# Patient Record
Sex: Male | Born: 1950 | Race: White | Hispanic: Yes | Smoking: Never smoker
Health system: Southern US, Community
[De-identification: ages and names within clinical notes are randomized; demographics above are authoritative.]

## PROBLEM LIST (undated history)

## (undated) DIAGNOSIS — E785 Hyperlipidemia, unspecified: Secondary | ICD-10-CM

## (undated) DIAGNOSIS — R000 Tachycardia, unspecified: Secondary | ICD-10-CM

## (undated) DIAGNOSIS — E119 Type 2 diabetes mellitus without complications: Secondary | ICD-10-CM

## (undated) DIAGNOSIS — M1990 Unspecified osteoarthritis, unspecified site: Secondary | ICD-10-CM

## (undated) DIAGNOSIS — I10 Essential (primary) hypertension: Secondary | ICD-10-CM

## (undated) DIAGNOSIS — R011 Cardiac murmur, unspecified: Secondary | ICD-10-CM

## (undated) DIAGNOSIS — K219 Gastro-esophageal reflux disease without esophagitis: Secondary | ICD-10-CM

## (undated) DIAGNOSIS — R569 Unspecified convulsions: Secondary | ICD-10-CM

## (undated) DIAGNOSIS — N529 Male erectile dysfunction, unspecified: Secondary | ICD-10-CM

## (undated) DIAGNOSIS — R7302 Impaired glucose tolerance (oral): Secondary | ICD-10-CM

## (undated) DIAGNOSIS — I219 Acute myocardial infarction, unspecified: Secondary | ICD-10-CM

## (undated) HISTORY — DX: Male erectile dysfunction, unspecified: N52.9

## (undated) HISTORY — DX: Unspecified convulsions: R56.9

## (undated) HISTORY — PX: NOSE SURGERY: SHX723

## (undated) HISTORY — DX: Essential (primary) hypertension: I10

## (undated) HISTORY — DX: Cardiac murmur, unspecified: R01.1

## (undated) HISTORY — DX: Tachycardia, unspecified: R00.0

## (undated) HISTORY — PX: MANDIBLE SURGERY: SHX707

## (undated) HISTORY — DX: Type 2 diabetes mellitus without complications: E11.9

## (undated) HISTORY — PX: OTHER SURGICAL HISTORY: SHX169

## (undated) HISTORY — DX: Unspecified osteoarthritis, unspecified site: M19.90

## (undated) HISTORY — DX: Impaired glucose tolerance (oral): R73.02

## (undated) HISTORY — DX: Gastro-esophageal reflux disease without esophagitis: K21.9

## (undated) HISTORY — DX: Hyperlipidemia, unspecified: E78.5

## (undated) HISTORY — PX: KNEE ARTHROSCOPY: SUR90

## (undated) HISTORY — DX: Acute myocardial infarction, unspecified: I21.9

---

## 2006-10-30 HISTORY — PX: COLONOSCOPY: SHX174

## 2006-10-30 HISTORY — PX: ESOPHAGOGASTRODUODENOSCOPY: SHX1529

## 2014-05-19 ENCOUNTER — Other Ambulatory Visit (HOSPITAL_BASED_OUTPATIENT_CLINIC_OR_DEPARTMENT_OTHER): Payer: Self-pay | Admitting: Internal Medicine

## 2014-05-19 DIAGNOSIS — R1031 Right lower quadrant pain: Secondary | ICD-10-CM

## 2014-05-20 ENCOUNTER — Ambulatory Visit (HOSPITAL_BASED_OUTPATIENT_CLINIC_OR_DEPARTMENT_OTHER): Payer: PRIVATE HEALTH INSURANCE

## 2014-06-08 ENCOUNTER — Encounter: Payer: Self-pay | Admitting: *Deleted

## 2014-06-09 ENCOUNTER — Telehealth: Payer: Self-pay

## 2014-06-09 ENCOUNTER — Ambulatory Visit (INDEPENDENT_AMBULATORY_CARE_PROVIDER_SITE_OTHER): Payer: PRIVATE HEALTH INSURANCE | Admitting: Neurology

## 2014-06-09 ENCOUNTER — Encounter: Payer: Self-pay | Admitting: Neurology

## 2014-06-09 VITALS — BP 154/84 | HR 66 | Ht 67.0 in | Wt 183.0 lb

## 2014-06-09 DIAGNOSIS — E669 Obesity, unspecified: Secondary | ICD-10-CM

## 2014-06-09 DIAGNOSIS — R519 Headache, unspecified: Secondary | ICD-10-CM | POA: Insufficient documentation

## 2014-06-09 DIAGNOSIS — M542 Cervicalgia: Secondary | ICD-10-CM | POA: Insufficient documentation

## 2014-06-09 DIAGNOSIS — R51 Headache: Secondary | ICD-10-CM

## 2014-06-09 MED ORDER — NORTRIPTYLINE HCL 25 MG PO CAPS
25.0000 mg | ORAL_CAPSULE | Freq: Every day | ORAL | Status: DC
Start: 2014-06-09 — End: 2014-06-26

## 2014-06-09 NOTE — Telephone Encounter (Signed)
Called and spoke to patient and patient is aware that he can't have CT today. Patient understood and we tried to do everything we could. [place] is leaving for [place]. Dr. Lucia GaskinsAhern will call patient with further details.

## 2014-06-09 NOTE — Progress Notes (Signed)
[facility]    Provider:  Dr Lucia Gaskins Referring Provider: Jackie Plum, MD Primary Care Physician:  Jackie Plum, MD  CC:  headache  HPI:  Roy Mccoy is a 63 y.o. male here as a referral from Dr. Julio Sicks for headache   Headache started one month ago. At the same time he got new prescription glasses but not using them consistently. No inciting factors, just woke up with a headache one day. He is having some pain in the neck with tenderness for 5-6 months. He feels like he is not sleeping on his neck right but tenderness started after lifting a sofa 2-3 months ago and not related to headache. He got up one morning and had a headache and in the afternoon he started feeling a dull pain in the left frontal area. The feeling is constant all day long.  He works 10-12 hours a day but routine hasn't changed. No increased work stress. First time this has happened, may once once many years ago had a migranous episode but otherwise no headaches. No associated light, sound sensitivity. No nausea no vomiting. No associated focal neurologic symptoms. His physician gave him fioricet and been taking fioricet 3x a day and is weaning himself off but it has not helped or . Has also been taking advil 1-2x a day for his knee for a year a half. Has high blood pressure but is well controlled and his medication was increased recently which didn't help the headaches. Not sensitive to touch. Can get up to 7/10 severity.  No aura. Some days it is worse than others. Never had imaging of his brain. Leaving tomorrow at 5:30am for 10 days. Never had anything like this happen to him. No visual changes, no hearing change, no weakness, no sensory changes, no cognitive changess. No radicular symptoms but does have muscular pain in the left side of the neck, same side as the pain in the head although neck pain does not correspond with headache. Patient is sleeping well. Took hydrocodone once and didn't  help headache. Started on the pamelor 25mg  at night but not taking it consistently. Taking a baby aspirin. Headache Not positional. Nothing is helping the headache. No stabbing pain, no conjunctival injection, no lacrimation. Persistent all day. Doesn't wake him up at night. No family history of headaches or neurologic disorders.    Reviewed notes, labs and imaging from outside physicians, which detailed similar symptoms as patient describes today. Notes state that patient was also given 7 days of prednisone 10mg  daily, amlodipine was increased on 06/04/2014, also take atenolol for HTN.   Review of Systems: Out of a complete 14 system review, the patient complains of above symptoms per HPI as well as headache.   and all other reviewed systems are negative. Pertinent negatives per HPI.  History   Social History  . Marital Status: Divorced    Spouse Name: N/A    Number of Children: 4  . Years of Education: BA   Occupational History  .     Social History Main Topics  . Smoking status: Never Smoker   . Smokeless tobacco: Never Used  . Alcohol Use: Yes  . Drug Use: No  . Sexual Activity: Not on file   Other Topics Concern  . Not on file   Social History Narrative   Patient is single with 4 children   Patient is right handed   Patient has a BA degree   Patient drinks 1 cup daily.    No  family history on file.  Past Medical History  Diagnosis Date  . Hypertension   . Hyperlipidemia     No past surgical history on file.  Current Outpatient Prescriptions  Medication Sig Dispense Refill  . amLODipine (NORVASC) 10 MG tablet Take 10 mg by mouth daily.      Marland Kitchen. atenolol (TENORMIN) 100 MG tablet Take 100 mg by mouth daily.      . butalbital-acetaminophen-caffeine (FIORICET) 50-325-40 MG per tablet Take 1 tablet by mouth every 6 (six) hours as needed for headache (Take 1-2 tablets every 6 hours prn).      . diclofenac (VOLTAREN) 50 MG EC tablet Take 50 mg by mouth 3 (three) times  daily.      Marland Kitchen. HYDROcodone-ibuprofen (VICOPROFEN) 7.5-200 MG per tablet       . nortriptyline (PAMELOR) 25 MG capsule Take 25 mg by mouth at bedtime.      . predniSONE (DELTASONE) 10 MG tablet       . tiZANidine (ZANAFLEX) 2 MG tablet Take 2 mg by mouth every 6 (six) hours as needed for muscle spasms (Taking 1-2 tablets, 1-3 times daily as needed).      . vardenafil (LEVITRA) 20 MG tablet Take 20 mg by mouth daily.       No current facility-administered medications for this visit.    Allergies as of 06/09/2014 - Review Complete 06/09/2014  Allergen Reaction Noted  . Other  06/09/2014    Vitals: BP 154/84  Pulse 66  Ht 5\' 7"  (1.702 m)  Wt 183 lb (83.008 kg)  BMI 28.66 kg/m2 Last Weight:  Wt Readings from Last 1 Encounters:  06/09/14 183 lb (83.008 kg)   Last Height:   Ht Readings from Last 1 Encounters:  06/09/14 5\' 7"  (1.702 m)     Physical exam: Exam: Gen: NAD, conversant Eyes: anicteric sclerae, moist conjunctivae HENT: Atraumatic, oropharynx clear Neck: Trachea midline; supple,  Lungs: CTA, no wheezing, rales, rhonic                          CV: RRR, no MRG Abdomen: Soft, non-tender;  Extremities: No peripheral edema  Skin: Normal temperature, no rash,  Psych: Appropriate affect, pleasant  Neuro: MS: AA&Ox3, appropriately interactive, normal affect   Speech: fluent w/o paraphasic error  Memory: good recent and remote recall  CN: PERRL, EOMI no nystagmus, fundi flat, no ptosis, sensation intact to LT V1-V3 bilat, face symmetric, no weakness, hearing grossly intact, palate elevates symmetrically, shoulder shrug 5/5 bilat,  tongue protrudes midline, no fasiculations noted.  Motor: normal bulk and tone Strength: 5/5  In all extremities  Coord: rapid alternating and point-to-point (FNF, HTS) movements intact.  Reflexes: symmetrical, bilat downgoing toes  Sens: LT intact in all extremities  Gait: posture, stance, stride and arm-swing normal. Tandem gait  intact. Able to walk on heels and toes. Romberg absent.   Assessment and plan:  63 year old male with a PMHx of HTN, high cholesterol, glucose intolerance, LBP, erectile dysfunction who is here for new onset, focal, severe head pain. Patient does not have a PMHx of headaches. Headache was sudden in onset and is localized to the left frontal area, not positional. No migrainous features and unusual in the dull quality, sudden and constant nature and restricted area of the pain. The pain is not sensitive to touch or palpation. Patient has tried OTC medications as well as prescription medications such as Pamelor, Prednisone, hydrocodone without effect. Symptoms do  not appear migrainous or tension-type or neuralgia. Neuro exam was normal and patient does not report any focal neurologic symptoms. May be a headache/migraine variant or cervicogenic pain but need to rule out intracranial causes such as mass lesion. Will order an MRI of the brain. Patient can continue Pamelor but discussed compliance and taking it every evening. Discontinue the hydrocodone with ibuprofen and decrease the fioricet until stopped. Discussed rebound headaches, good sleep hygiene, staying well hydrated and eating regular meals with daily moderate exercise as tolerated. Proceed to the ED if symptoms worsen.   A total of 75 minutes was spent in with this patient. Over half this time was spent on counseling patient on the diagnosis and different therapeutic options available.    Naomie Dean, MD  [facility] [address] [place], [place] [address]  Phone [phone] Fax [phone]

## 2014-06-09 NOTE — Patient Instructions (Addendum)
Please proceed to imaging today for a scan of your head and brain.  Continue the pamelor 25mg  at night. We discussed the most common side effects such as fatigue, drowsiness, dizzyness, dry mouth Discontinue the hydrocodone with ibuprofen and decrease the fioricet until stopped  Remember to drink plenty of fluid, eat healthy meals and do not skip any meals. Try to eat protein with a every meal and eat a healthy snack such as fruit or nuts in between meals. Try to keep a regular sleep-wake schedule and try to exercise daily, particularly in the form of walking, 20-30 minutes a day, if you can.   I would like to see you back when you return from [place] in one month, sooner if we need to. Please call us with any interim questions, concerns, problems, updates or refill requests. If symptoms worsen or progress please go directly to an emergency room.  We will call you with test results so we can go over those with you on the phone.  My clinical assistant and will answer any of your questions and relay your messages to me and also relay most of my messages to you.   Our phone number is [phone]. We also have an after hours call service for urgent matters and there is a physician on-call for urgent questions. For any emergencies you know to call 911 or go to the nearest emergency room

## 2014-06-10 NOTE — Telephone Encounter (Signed)
Dr.Ahern did order MRI. Patient will be having in [place] , Patient will call me back 06/11/2014. To tell me where he wants the location. For Insurance we have to have location MRI.

## 2014-06-11 NOTE — Telephone Encounter (Signed)
Patient called back and he wants to have his MRI at. [facility]. [place] [id number] Phone numbers [phone] or [phone] - Carollee HerterShannon will finish getting MRI done.

## 2014-06-23 DIAGNOSIS — R51 Headache: Secondary | ICD-10-CM

## 2014-06-26 ENCOUNTER — Other Ambulatory Visit: Payer: Self-pay | Admitting: Neurology

## 2014-06-26 ENCOUNTER — Other Ambulatory Visit: Payer: Self-pay | Admitting: Diagnostic Neuroimaging

## 2014-06-26 ENCOUNTER — Telehealth: Payer: Self-pay | Admitting: Neurology

## 2014-06-26 DIAGNOSIS — R51 Headache: Secondary | ICD-10-CM

## 2014-06-26 MED ORDER — NORTRIPTYLINE HCL 50 MG PO CAPS: 50.0000 mg | ORAL_CAPSULE | Freq: Every day | ORAL | Status: DC

## 2014-06-26 NOTE — Telephone Encounter (Signed)
Pt requesting MRI results. Please advise.

## 2014-06-26 NOTE — Telephone Encounter (Signed)
Patient requesting results of recent MRI. Best number to call is [phone] anytime of day.

## 2014-06-26 NOTE — Telephone Encounter (Signed)
Spoke with patient and discussed the mri of his brain. Mild non-specific white matter disease, informed patient that he should monitor and tightly manage blood pressure and other vascular risk factors such as glucose and cholesterol with his primary care. Regular follow up is important. Headaches have improved. Will increase his Pamelor to  qhs. He would like to come to the office on Monday to check in. Please put him on my schedule for Monday August 31st as an add on at 1pm. Thank you.

## 2014-06-29 ENCOUNTER — Ambulatory Visit (INDEPENDENT_AMBULATORY_CARE_PROVIDER_SITE_OTHER): Payer: PRIVATE HEALTH INSURANCE | Admitting: Neurology

## 2014-06-29 ENCOUNTER — Encounter: Payer: Self-pay | Admitting: Neurology

## 2014-06-29 VITALS — BP 139/83 | HR 65 | Ht 67.0 in | Wt 184.0 lb

## 2014-06-29 DIAGNOSIS — M542 Cervicalgia: Secondary | ICD-10-CM

## 2014-06-29 DIAGNOSIS — R51 Headache: Secondary | ICD-10-CM

## 2014-06-29 NOTE — Telephone Encounter (Signed)
Patient is on Dr.Ahern schedule for today.

## 2014-06-29 NOTE — Progress Notes (Signed)
[facility]    Provider:  Dr Lucia Gaskins Referring Provider: Jackie Plum, MD Primary Care Physician:  Jackie Plum, MD  CC:  Headache  HPI:  Roy Mccoy is a 63 y.o. male here as a referral from Dr. Julio Sicks for Headache  Interval History: He had some dizziness in [place]. He was started on meclizine. It is resolved. While he was in [place] the headache improved. But now the headache is back to what it was before. He feels like a rubber band sensation from the back of his neck to the head. When he twists his head to the left, he gets a little sensation of the headache he complains about. The feeling is still pressure in a localized area. Not stinging, not tingling, not burning, not sensitive to the touch. No sound sensitivity, no light sensitivity. Meclizine resolved the dizzyness, got up with the sensation of the room shaking like shaking a camera. No side effects to the pamelor, no excessive sedation or dry mouth.  Also getting some sinus type pressure pain in the forehead. No rhinorrhea or tearing or eye injection. Discomfort is down on the left of the neck. Has a clicking noise in his neck. No radicular symptoms but lots of neck pain and pain on turning the head. Prednisone pack didn't help.   HPI: Roy Mccoy is a 63 y.o. male here as a referral from Dr. Julio Sicks for headache  Headache started one month ago. At the same time he got new prescription glasses but not using them consistently. No inciting factors, just woke up with a headache one day. He is having some pain in the neck with tenderness for 5-6 months. He feels like he is not sleeping on his neck right but tenderness started after lifting a sofa 2-3 months ago and not related to headache. He got up one morning and had a headache and in the afternoon he started feeling a dull pain in the left frontal area. The feeling is constant all day long. He works 10-12 hours a day but routine hasn't  changed. No increased work stress. First time this has happened, may once once many years ago had a migranous episode but otherwise no headaches. No associated light, sound sensitivity. No nausea no vomiting. No associated focal neurologic symptoms. His physician gave him fioricet and been taking fioricet 3x a day and is weaning himself off but it has not helped or . Has also been taking advil 1-2x a day for his knee for a year a half. Has high blood pressure but is well controlled and his medication was increased recently which didn't help the headaches. Not sensitive to touch. Can get up to 7/10 severity. No aura. Some days it is worse than others. Never had imaging of his brain. Leaving tomorrow at 5:30am for 10 days. Never had anything like this happen to him. No visual changes, no hearing change, no weakness, no sensory changes, no cognitive changess. No radicular symptoms but does have muscular pain in the left side of the neck, same side as the pain in the head although neck pain does not correspond with headache. Patient is sleeping well. Took hydrocodone once and didn't help headache. Started on the pamelor  at night but not taking it consistently. Taking a baby aspirin. Headache Not positional. Nothing is helping the headache. No stabbing pain, no conjunctival injection, no lacrimation. Persistent all day. Doesn't wake him up at night. No family history of headaches or neurologic disorders.  Reviewed notes, labs and  imaging from outside physicians, which detailed similar symptoms as patient describes today. Notes state that patient was also given 7 days of prednisone  daily, amlodipine was increased on 06/04/2014, also take atenolol for HTN.    Reviewed notes, labs and imaging from outside physicians, which showed MRI of the brain unremarkable, some mild non-specific white-matter disease  Review of Systems: Patient complains of symptoms per HPI as well as the following symptoms dizziness and  headache. Pertinent negatives per HPI. Otherwise out of a complete 14 system review, and all other reviewed systems are negative.   History   Social History  . Marital Status: Divorced    Spouse Name: N/A    Number of Children: 4  . Years of Education: BA   Occupational History  .     Social History Main Topics  . Smoking status: Never Smoker   . Smokeless tobacco: Never Used  . Alcohol Use: No  . Drug Use: No  . Sexual Activity: Not on file   Other Topics Concern  . Not on file   Social History Narrative   Patient is single with 4 children   Patient is right handed   Patient has a BA degree   Patient drinks 1 cup daily.    Family History  Problem Relation Age of Onset  . Headache Neg Hx     Past Medical History  Diagnosis Date  . Hypertension   . Hyperlipidemia   . Glucose intolerance (impaired glucose tolerance)   . Erectile dysfunction     Past Surgical History  Procedure Laterality Date  . Replacement total knee      Current Outpatient Prescriptions  Medication Sig Dispense Refill  . amLODipine (NORVASC) 10 MG tablet Take 10 mg by mouth daily.      Marland Kitchen atenolol (TENORMIN) 100 MG tablet Take 100 mg by mouth daily.      . diclofenac (VOLTAREN) 50 MG EC tablet Take 50 mg by mouth 3 (three) times daily.      . nortriptyline (PAMELOR) 50 MG capsule Take 1 capsule (50 mg total) by mouth at bedtime.  30 capsule  3  . predniSONE (DELTASONE) 10 MG tablet       . tiZANidine (ZANAFLEX) 2 MG tablet Take 2 mg by mouth every 6 (six) hours as needed for muscle spasms (Taking 1-2 tablets, 1-3 times daily as needed).      . vardenafil (LEVITRA) 20 MG tablet Take 20 mg by mouth daily.      . butalbital-acetaminophen-caffeine (FIORICET, ESGIC) 50-325-40 MG per tablet        No current facility-administered medications for this visit.    Allergies as of 06/29/2014 - Review Complete 06/29/2014  Allergen Reaction Noted  . Other  06/09/2014    Vitals: BP 139/83  Pulse  65  Ht  (1.702 m)  Wt 184 lb (83.462 kg)  BMI 28.81 kg/m2 Last Weight:  Wt Readings from Last 1 Encounters:  06/29/14 184 lb (83.462 kg)   Last Height:   Ht Readings from Last 1 Encounters:  06/29/14  (1.702 m)   Physical exam:  Exam:  Gen: NAD, conversant  Eyes: anicteric sclerae, moist conjunctivae  HENT: Atraumatic, oropharynx clear  Neck: Trachea midline; supple,  Lungs: CTA, no wheezing, rales, rhonic  CV: RRR, no MRG  Abdomen: Soft, non-tender;  Extremities: No peripheral edema  Skin: Normal temperature, no rash,  Psych: Appropriate affect, pleasant  Neuro:  MS: AA&Ox3, appropriately interactive, normal affect  Speech:  fluent w/o paraphasic error   Memory: good recent and remote recall   CN:  PERRL, EOMI no nystagmus, fundi flat, no ptosis, sensation intact to LT V1-V3 bilat, face symmetric, no weakness, hearing grossly intact, palate elevates symmetrically, shoulder shrug 5/5 bilat,  tongue protrudes midline, no fasiculations noted.  Motor: normal bulk and tone  Strength:  5/5 In all extremities  Coord: rapid alternating and point-to-point (FNF, HTS) movements intact.  Reflexes: symmetrical, bilat downgoing toes  Sens: LT intact in all extremities  Gait: posture, stance, stride and arm-swing normal. Tandem gait intact. Able to walk on heels and toes. Romberg absent.    Assessment and plan: 63 year old male with a PMHx of HTN, high cholesterol, glucose intolerance, LBP, erectile dysfunction who is here for new onset, focal, severe head pain. Patient does not have a PMHx of headaches. Headache was sudden in onset and is localized to the left frontal area, not positional. No migrainous features and unusual in the dull quality, sudden and constant nature and restricted area of the pain. The pain is not sensitive to touch or palpation. Patient has tried OTC medications as well as prescription medications such as Pamelor, Prednisone, hydrocodone without effect.  Symptoms do not appear migrainous. Today he does state the pain is more pressure like and radiates to the frontal region so possibly is tension type. Also says he feels like a rubber band snapping when he turns his head so a component or occipital neuralgia or cervicalgia? Neuro exam was normal and patient does not report any focal neurologic symptoms. MMRI of the brain unremarkable and reviewed wit him today. Patient can continue Pamelor at a higher dose but discussed compliance and taking it every evening. Discontinued the hydrocodone with ibuprofen and decrease the fioricet until stopped. Discussed rebound headaches, good sleep hygiene, staying well hydrated and eating regular meals with daily moderate exercise as tolerated. Proceed to the ED if symptoms worsen.   Will increase Pamelor to  at night. If doesn't help can change medication. Can take take tizanide for muscle spasms as prescribed Try an Occipital nerve block left side if these therapies fail Try sleeping on the opposite (right) side and use a cervical pillow.    Naomie Dean, MD  [facility] [address] [place], [place] [address]  Phone [phone] Fax [phone]

## 2014-06-29 NOTE — Patient Instructions (Signed)
Overall you are doing fairly well but I do want to suggest a few things today:   Remember to drink plenty of fluid, eat healthy meals and do not skip any meals. Try to eat protein with a every meal and eat a healthy snack such as fruit or nuts in between meals. Try to keep a regular sleep-wake schedule and try to exercise daily, particularly in the form of walking, 20-30 minutes a day, if you can.   As far as your medications are concerned, I would like to suggest continue with the increased dose of Pamelor (nortrityline).   As far as diagnostic testing: MRI of the cervical spine, physical therapy for neck pain  I would like to see you back in 3 months, sooner if we need to. Please call us with any interim questions, concerns, problems, updates or refill requests.   Please also call us for any test results so we can go over those with you on the phone.  My clinical assistant and will answer any of your questions and relay your messages to me and also relay most of my messages to you.   Our phone number is [phone]. We also have an after hours call service for urgent matters and there is a physician on-call for urgent questions. For any emergencies you know to call 911 or go to the nearest emergency room

## 2014-07-02 NOTE — Addendum Note (Signed)
Addended by: Naomie Dean B on: 07/02/2014 06:16 PM   Modules accepted: Orders

## 2014-07-08 ENCOUNTER — Other Ambulatory Visit: Payer: Self-pay | Admitting: Neurology

## 2014-07-08 ENCOUNTER — Telehealth: Payer: Self-pay | Admitting: Neurology

## 2014-07-08 MED ORDER — TOPIRAMATE 25 MG PO TABS: 50.0000 mg | ORAL_TABLET | Freq: Two times a day (BID) | ORAL | Status: DC

## 2014-07-08 NOTE — Telephone Encounter (Signed)
Spoke to patient and he relayed that the Pamelor has not helped, there is no change in his headache.  He asked that the doctor call him at [phone].

## 2014-07-09 ENCOUNTER — Ambulatory Visit: Payer: PRIVATE HEALTH INSURANCE | Attending: Neurology | Admitting: Physical Therapy

## 2014-07-09 DIAGNOSIS — M542 Cervicalgia: Secondary | ICD-10-CM | POA: Insufficient documentation

## 2014-07-09 DIAGNOSIS — IMO0001 Reserved for inherently not codable concepts without codable children: Secondary | ICD-10-CM | POA: Insufficient documentation

## 2014-07-09 DIAGNOSIS — R293 Abnormal posture: Secondary | ICD-10-CM | POA: Insufficient documentation

## 2014-07-10 MED ORDER — TOPIRAMATE 25 MG PO TABS: ORAL_TABLET | ORAL | Status: DC

## 2014-07-10 NOTE — Telephone Encounter (Signed)
Patient waiting on Rx for Topamax to be sent to Pharmacy.  Please call and advise

## 2014-07-10 NOTE — Telephone Encounter (Signed)
It appears Dr Lucia Gaskins added Topamax to med list on 09/09, and Rx was sent to print.  I have resent the Rx to pharmacy electronically.  I called the patient back.  Got no answer.  Left message.

## 2014-07-14 ENCOUNTER — Ambulatory Visit: Payer: PRIVATE HEALTH INSURANCE | Admitting: Rehabilitation

## 2014-07-17 ENCOUNTER — Ambulatory Visit: Payer: PRIVATE HEALTH INSURANCE | Admitting: Physical Therapy

## 2014-07-17 DIAGNOSIS — IMO0001 Reserved for inherently not codable concepts without codable children: Secondary | ICD-10-CM | POA: Diagnosis not present

## 2014-07-21 ENCOUNTER — Ambulatory Visit: Payer: PRIVATE HEALTH INSURANCE | Admitting: Rehabilitation

## 2014-07-23 ENCOUNTER — Ambulatory Visit: Payer: PRIVATE HEALTH INSURANCE | Admitting: Physical Therapy

## 2014-07-23 DIAGNOSIS — IMO0001 Reserved for inherently not codable concepts without codable children: Secondary | ICD-10-CM | POA: Diagnosis not present

## 2014-07-28 ENCOUNTER — Ambulatory Visit: Payer: PRIVATE HEALTH INSURANCE | Admitting: Physical Therapy

## 2014-07-28 DIAGNOSIS — IMO0001 Reserved for inherently not codable concepts without codable children: Secondary | ICD-10-CM | POA: Diagnosis not present

## 2014-07-30 ENCOUNTER — Ambulatory Visit: Payer: PRIVATE HEALTH INSURANCE | Attending: Neurology | Admitting: Physical Therapy

## 2014-07-30 DIAGNOSIS — R293 Abnormal posture: Secondary | ICD-10-CM | POA: Diagnosis not present

## 2014-07-30 DIAGNOSIS — M542 Cervicalgia: Secondary | ICD-10-CM | POA: Diagnosis not present

## 2014-07-30 DIAGNOSIS — Z5189 Encounter for other specified aftercare: Secondary | ICD-10-CM | POA: Insufficient documentation

## 2014-08-04 ENCOUNTER — Ambulatory Visit: Payer: PRIVATE HEALTH INSURANCE | Admitting: Physical Therapy

## 2014-08-04 DIAGNOSIS — Z5189 Encounter for other specified aftercare: Secondary | ICD-10-CM | POA: Diagnosis not present

## 2014-08-06 ENCOUNTER — Ambulatory Visit: Payer: PRIVATE HEALTH INSURANCE | Admitting: Rehabilitation

## 2014-08-06 DIAGNOSIS — Z5189 Encounter for other specified aftercare: Secondary | ICD-10-CM | POA: Diagnosis not present

## 2014-08-11 ENCOUNTER — Ambulatory Visit: Payer: PRIVATE HEALTH INSURANCE | Admitting: Physical Therapy

## 2014-08-13 ENCOUNTER — Ambulatory Visit: Payer: PRIVATE HEALTH INSURANCE | Admitting: Physical Therapy

## 2014-08-17 ENCOUNTER — Ambulatory Visit: Payer: PRIVATE HEALTH INSURANCE | Admitting: Physical Therapy

## 2014-08-18 ENCOUNTER — Ambulatory Visit: Payer: PRIVATE HEALTH INSURANCE | Admitting: Rehabilitation

## 2014-08-25 ENCOUNTER — Ambulatory Visit: Payer: PRIVATE HEALTH INSURANCE | Admitting: Physical Therapy

## 2014-08-27 ENCOUNTER — Ambulatory Visit: Payer: PRIVATE HEALTH INSURANCE | Admitting: Physical Therapy

## 2014-08-31 ENCOUNTER — Ambulatory Visit: Payer: PRIVATE HEALTH INSURANCE | Admitting: Physical Therapy

## 2014-09-01 ENCOUNTER — Ambulatory Visit: Payer: PRIVATE HEALTH INSURANCE | Attending: Neurology | Admitting: Physical Therapy

## 2014-09-01 DIAGNOSIS — R293 Abnormal posture: Secondary | ICD-10-CM | POA: Insufficient documentation

## 2014-09-01 DIAGNOSIS — Z5189 Encounter for other specified aftercare: Secondary | ICD-10-CM | POA: Insufficient documentation

## 2014-09-01 DIAGNOSIS — M542 Cervicalgia: Secondary | ICD-10-CM | POA: Insufficient documentation

## 2014-09-02 ENCOUNTER — Ambulatory Visit: Payer: PRIVATE HEALTH INSURANCE | Admitting: Physical Therapy

## 2014-09-03 ENCOUNTER — Ambulatory Visit: Payer: PRIVATE HEALTH INSURANCE | Admitting: Physical Therapy

## 2014-09-07 ENCOUNTER — Ambulatory Visit: Payer: PRIVATE HEALTH INSURANCE | Admitting: Physical Therapy

## 2014-09-10 ENCOUNTER — Ambulatory Visit: Payer: PRIVATE HEALTH INSURANCE | Admitting: Rehabilitation

## 2015-01-20 ENCOUNTER — Ambulatory Visit: Payer: PRIVATE HEALTH INSURANCE | Admitting: Podiatry

## 2015-10-31 HISTORY — PX: HIP SURGERY: SHX245

## 2018-06-05 ENCOUNTER — Ambulatory Visit: Payer: PRIVATE HEALTH INSURANCE | Admitting: Podiatry

## 2018-11-01 ENCOUNTER — Encounter: Payer: Self-pay | Admitting: Gastroenterology

## 2018-11-14 ENCOUNTER — Encounter: Payer: Self-pay | Admitting: Gastroenterology

## 2018-11-20 ENCOUNTER — Ambulatory Visit (AMBULATORY_SURGERY_CENTER): Payer: Self-pay | Admitting: *Deleted

## 2018-11-20 VITALS — Ht 67.0 in | Wt 186.0 lb

## 2018-11-20 DIAGNOSIS — Z1211 Encounter for screening for malignant neoplasm of colon: Secondary | ICD-10-CM

## 2018-11-20 NOTE — Progress Notes (Signed)
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 Gave AVS with date time and address to pt and wife today

## 2018-11-21 ENCOUNTER — Encounter: Payer: Self-pay | Admitting: *Deleted

## 2018-11-21 DIAGNOSIS — T884XXA Failed or difficult intubation, initial encounter: Secondary | ICD-10-CM | POA: Insufficient documentation

## 2018-11-22 ENCOUNTER — Encounter: Payer: Self-pay | Admitting: Gastroenterology

## 2018-11-22 ENCOUNTER — Ambulatory Visit: Payer: 59 | Admitting: Gastroenterology

## 2018-11-22 VITALS — BP 138/70 | HR 59 | Ht 67.0 in | Wt 186.1 lb

## 2018-11-22 DIAGNOSIS — Z1211 Encounter for screening for malignant neoplasm of colon: Secondary | ICD-10-CM | POA: Diagnosis not present

## 2018-11-22 DIAGNOSIS — K219 Gastro-esophageal reflux disease without esophagitis: Secondary | ICD-10-CM

## 2018-11-22 DIAGNOSIS — Z1212 Encounter for screening for malignant neoplasm of rectum: Secondary | ICD-10-CM | POA: Diagnosis not present

## 2018-11-22 MED ORDER — SOD PICOSULFATE-MAG OX-CIT ACD 10-3.5-12 MG-GM -GM/160ML PO SOLN: 1.0000 | ORAL | 0 refills | Status: AC

## 2018-11-22 NOTE — Patient Instructions (Addendum)
If you are age 67 or older, your body mass index should be between 23-30. Your Body mass index is 29.15 kg/m. If this is out of the aforementioned range listed, please consider follow up with your Primary Care Provider.  If you are age 37 or younger, your body mass index should be between 19-25. Your Body mass index is 29.15 kg/m. If this is out of the aformentioned range listed, please consider follow up with your Primary Care Provider.   You have been scheduled for an endoscopy and colonoscopy. Please follow the written instructions given to you at your visit today. Please pick up your prep supplies at the pharmacy within the next 1-3 days. If you use inhalers (even only as needed), please bring them with you on the day of your procedure. Your physician has requested that you go to www.startemmi.com and enter the access code given to you at your visit today. This web site gives a general overview about your procedure. However, you should still follow specific instructions given to you by our office regarding your preparation for the procedure.   We have sent the following medications to your pharmacy for you to pick up at your convenience: Clenpiq  Please purchase the following medications over the counter and take as directed: Ducolax  It was a pleasure to see you today!  Vito Cirigliano, D.O.

## 2018-11-22 NOTE — Progress Notes (Signed)
Chief Complaint: GERD, constipation, CRC screening   Referring Provider:   Shelbie ProctorYuri Milton Cabeza, MD [facility](PCM)   HPI:     Roy Mccoy is a 68 y.o. male referred to the Gastro[facility]ology Clinic for evaluation of:  1) CRC screening: Last colonsocopy was approximately 12 years ago in [place] and normal/no polyps, with recommendation to repeat in 10 years.  Otherwise, no hematochezia, melena, abdominal pain.  No known family history of CRC or GI malignancies.  2) GERD: Longstanding history of Daily HB and regurgitation. Sxs worse with spicy foods and espresso. Sxs currently well controlled with Prilosec 20 mg QAM, but now requires daily therapy and will predictably have breakthrough symptoms with any missed doses. No dysphagia or odynophagia.   3) Hx of H pylori: History of H. pylori "some years ago "treated with antimicrobial therapy without recurrence of abdominal pain.  4) Constipation: Endorses intermittent hard stool/straining which seems to coincide with taking Tylenol for knee pain.  Will typically take an OTC stool softener with resolution of symptoms.  Happens a couple times per month.  Had been treated with oxycodone in the past which certainly exacerbated the symptoms, which she stopped taking several months ago.  Interestingly, he states that he developed pleurisy after left knee arthroscopy and right hip replacement in the past.  Additionally, states that he has been told he has a "narrow airway" and difficulty with intubation with prior surgical procedures.  Otherwise no baseline pulmonary disease, asthma, etc.  No recent labs or imaging for review.  Previous endoscopy reports not available for review today.   Past Medical History:  Diagnosis Date  . Arthritis    hip , knee   . Diabetes mellitus without complication (HCC)   . Erectile dysfunction   . GERD (gastroesophageal reflux disease)   . Glucose intolerance (impaired glucose tolerance)   . Heart murmur   .  Hyperlipidemia   . Hypertension   . Myocardial infarction (HCC)    mild 12-13 years ago   . 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 Tachycardia    received amiodarone- 6 yrs ago [place]      Past Surgical History:  Procedure Laterality Date  . [place]ONOSCOPY  2008   in [place]  . ESOPHAGOGASTRODUODENOSCOPY  2008   in [place]  . HIP SURGERY Right 2017   replacement  . KNEE ARTHROSCOPY Left   . MANDIBLE SURGERY     broken jaw  . NOSE SURGERY     x2  . right foot surgery     Family History  Problem Relation Age of Onset  . Headache Neg Hx   . Colon cancer Neg Hx   . Colon polyps Neg Hx   . Esophageal cancer Neg Hx   . Rectal cancer Neg Hx   . Stomach cancer Neg Hx    Social History   Tobacco Use  . Smoking status: Never Smoker  . Smokeless tobacco: Never Used  Substance Use Topics  . Alcohol use: No  . Drug use: No   Current Outpatient Medications  Medication Sig Dispense Refill  . Acetaminophen (TYLENOL PO) Take 1 tablet by mouth as needed.    Marland Kitchen. amLODipine (NORVASC) 10 MG tablet Take 10 mg by mouth daily.    Marland Kitchen. aspirin EC 81 MG tablet Take 81 mg by mouth daily.    Marland Kitchen. atenolol (TENORMIN) 100 MG  tablet Take 100 mg by mouth daily.    . B Complex Vitamins (VITAMIN B COMPLEX PO) Take 1 tablet by mouth daily.    . chlorthalidone (HYGROTON) 25 MG tablet TAKE 1 TABLET BY MOUTH ONCE DAILY FOR 30 DAYS    . Cholecalciferol (VITAMIN D3) 125 MCG (5000 UT) TABS Take by mouth.    . diclofenac sodium (VOLTAREN) 1 % GEL Apply topically 4 (four) times daily.    Marland Kitchen. etodolac (LODINE) 400 MG tablet Take by mouth.    . losartan (COZAAR) 50 MG tablet Take 50 mg by mouth daily.    . metFORMIN (GLUCOPHAGE) 500 MG tablet Take 500 mg by mouth 2 (two) times daily.    . Multiple Vitamins-Minerals (MULTIVITAMIN ADULT PO) Take by mouth.    . naproxen (NAPROSYN) 500 MG tablet Take 500 mg by mouth 2 (two) times daily as needed.    Marland Kitchen. omeprazole (PRILOSEC) 20 MG capsule Take  20 mg by mouth daily.    . predniSONE (DELTASONE) 10 MG tablet     . simvastatin (ZOCOR) 20 MG tablet TAKE 1 TABLET BY MOUTH AT BEDTIME FOR 90 DAYS    . sulindac (CLINORIL) 200 MG tablet TAKE 1 TABLET BY MOUTH TWICE DAILY FOR 30 DAYS    . vardenafil (LEVITRA) 20 MG tablet Take 20 mg by mouth daily.    . vitamin B-12 (CYANOCOBALAMIN) 1000 MCG tablet Take by mouth.    . zolpidem (AMBIEN) 10 MG tablet Take 10 mg by mouth at bedtime.     No current facility-administered medications for this visit.    Allergies  Allergen Reactions  . Other     PEACHES,PLUMS, CHERRIES, APPLES, Soy caused swelling to lips      Review of Systems: All systems reviewed and negative except where noted in HPI.     Physical Exam:    Wt Readings from Last 3 Encounters:  11/22/18 186 lb 2 oz (84.4 kg)  11/20/18 186 lb (84.4 kg)  06/29/14 184 lb (83.5 kg)    BP 138/70   Pulse (!) 59   Ht 5\' 7"  (1.702 m)   Wt 186 lb 2 oz (84.4 kg)   BMI 29.15 kg/m  Constitutional:  Pleasant, in no acute distress. Psychiatric: Normal mood and affect. Behavior is normal. EENT: Pupils normal.  Conjunctivae are normal. No scleral icterus. Neck supple. No cervical LAD. Cardiovascular: Normal rate, regular rhythm. No edema Pulmonary/chest: Effort normal and breath sounds normal. No wheezing, rales or rhonchi. Abdominal: Soft, nondistended, nontender. Bowel sounds active throughout. There are no masses palpable. No hepatomegaly. Neurological: Alert and oriented to person place and time. Skin: Skin is warm and dry. No rashes noted.   ASSESSMENT AND PLAN;   Roy Mccoy is a 68 y.o. male presenting with:  1) CRC screening: Due for age-appropriate average risk CRC screening.  Last colon[place] approximately 12 years ago [place] and was normal/no polyps per patient. - Set up for average risk screening colonoscopy  2) GERD: Longstanding history of reflux, which is well controlled with daily PPI therapy.   Symptoms perhaps worse as now he requires daily therapy with breakthrough symptoms with any missed doses.  Per GI societal guidelines, meets criteria for Barrett's screening.  Additionally, EGD to assess for LES laxity, hiatal hernia, erosive esophagitis as possible preoperative evaluation for possible antireflux procedure as a means to stop or significantly reduce need for acid suppression therapy. - EGD -Resume antireflux lifestyle measures -Resume Prilosec  3) Constipation: Intermittent, mild change  in bowel habits which he thinks is related to APAP use.  Symptoms otherwise resolved with dose of OTC stool softener. - Resume prn OTC stool softener -Increase dietary fiber - Maintain adequate fluid intake  4) Chronic PPI use: I have reviewed the indications, risks, and benefits of PPI therapy with the patient today. I have discussed studies that raise ? of increased osteoporosis, dementia, and kidney failure and explained that these studies show very weak associations of unclear significance and not clear cause and effect. We did discuss the potential for vitamin malabsorption, to include magnesium (very rare), calcium (easily modifiable with Calcium Citrate supplement), vitamin B12 (again, correctable with oral B12 supplement), and iron (although rarely clinically significant outside patients who require iron supplementation previously), and can monitor each of these periodically with routine labs. We have agreed to continue PPI treatment in this case.  5) History of pleurisy: History of pleurisy following intubations for surgical procedures in the past.  As this is conscious sedation, with no plan for intubation, do not feel that this changes course of action.  Will certainly make Anesthesia staff aware prior to procedure.  I spent a total of 45 minutes of face-to-face time with the patient. Greater than 50% of the time was spent counseling and coordinating care.   The indications, risks, and  benefits of EGD and colonoscopy were explained to the patient in detail. Risks include but are not limited to bleeding, perforation, adverse reaction to medications, and cardiopulmonary compromise. Sequelae include but are not limited to the possibility of surgery, hositalization, and mortality. The patient verbalized understanding and wished to proceed. All questions answered, referred to scheduler and bowel prep ordered. Further recommendations pending results of the exam.    Shellia Cleverly, DO, FACG  11/22/2018, 9:24 AM   Quitman Livings, MD

## 2018-11-29 ENCOUNTER — Encounter: Payer: PRIVATE HEALTH INSURANCE | Admitting: Gastroenterology

## 2018-12-06 ENCOUNTER — Encounter: Payer: 59 | Admitting: Gastroenterology

## 2021-02-18 ENCOUNTER — Emergency Department (HOSPITAL_BASED_OUTPATIENT_CLINIC_OR_DEPARTMENT_OTHER): Payer: 59

## 2021-02-18 ENCOUNTER — Emergency Department (HOSPITAL_BASED_OUTPATIENT_CLINIC_OR_DEPARTMENT_OTHER)
Admission: EM | Admit: 2021-02-18 | Discharge: 2021-02-18 | Disposition: A | Discharge status: Home / Self Care | Payer: 59 | Attending: Emergency Medicine | Admitting: Emergency Medicine

## 2021-02-18 ENCOUNTER — Encounter (HOSPITAL_BASED_OUTPATIENT_CLINIC_OR_DEPARTMENT_OTHER): Payer: Self-pay

## 2021-02-18 ENCOUNTER — Other Ambulatory Visit: Payer: Self-pay

## 2021-02-18 DIAGNOSIS — I10 Essential (primary) hypertension: Secondary | ICD-10-CM | POA: Diagnosis not present

## 2021-02-18 DIAGNOSIS — Z7982 Long term (current) use of aspirin: Secondary | ICD-10-CM | POA: Diagnosis not present

## 2021-02-18 DIAGNOSIS — Z79899 Other long term (current) drug therapy: Secondary | ICD-10-CM | POA: Insufficient documentation

## 2021-02-18 DIAGNOSIS — R110 Nausea: Secondary | ICD-10-CM

## 2021-02-18 DIAGNOSIS — R42 Dizziness and giddiness: Secondary | ICD-10-CM

## 2021-02-18 DIAGNOSIS — Z7984 Long term (current) use of oral hypoglycemic drugs: Secondary | ICD-10-CM | POA: Insufficient documentation

## 2021-02-18 DIAGNOSIS — E119 Type 2 diabetes mellitus without complications: Secondary | ICD-10-CM | POA: Insufficient documentation

## 2021-02-18 LAB — RAPID URINE DRUG SCREEN, HOSP PERFORMED
Amphetamines: NOT DETECTED
Barbiturates: NOT DETECTED
Benzodiazepines: NOT DETECTED
Cocaine: NOT DETECTED
Opiates: NOT DETECTED
Tetrahydrocannabinol: NOT DETECTED

## 2021-02-18 LAB — DIFFERENTIAL
Abs Immature Granulocytes: 0 10*3/uL (ref 0.00–0.07)
Basophils Absolute: 0 10*3/uL (ref 0.0–0.1)
Basophils Relative: 0 %
Eosinophils Absolute: 0.2 10*3/uL (ref 0.0–0.5)
Eosinophils Relative: 2 %
Lymphocytes Relative: 24 %
Lymphs Abs: 2.4 10*3/uL (ref 0.7–4.0)
Monocytes Absolute: 0.2 10*3/uL (ref 0.1–1.0)
Monocytes Relative: 2 %
Neutro Abs: 7.3 10*3/uL (ref 1.7–7.7)
Neutrophils Relative %: 72 %

## 2021-02-18 LAB — URINALYSIS, ROUTINE W REFLEX MICROSCOPIC
Bilirubin Urine: NEGATIVE
Glucose, UA: NEGATIVE mg/dL
Hgb urine dipstick: NEGATIVE
Ketones, ur: NEGATIVE mg/dL
Leukocytes,Ua: NEGATIVE
Nitrite: NEGATIVE
Protein, ur: NEGATIVE mg/dL
Specific Gravity, Urine: 1.02 (ref 1.005–1.030)
pH: 5.5 (ref 5.0–8.0)

## 2021-02-18 LAB — COMPREHENSIVE METABOLIC PANEL
ALT: 24 U/L (ref 0–44)
AST: 20 U/L (ref 15–41)
Albumin: 4.3 g/dL (ref 3.5–5.0)
Alkaline Phosphatase: 55 U/L (ref 38–126)
Anion gap: 11 (ref 5–15)
BUN: 32 mg/dL — ABNORMAL HIGH (ref 8–23)
CO2: 24 mmol/L (ref 22–32)
Calcium: 9.3 mg/dL (ref 8.9–10.3)
Chloride: 101 mmol/L (ref 98–111)
Creatinine, Ser: 1.13 mg/dL (ref 0.61–1.24)
GFR, Estimated: >60 mL/min (ref >60)
Glucose, Bld: 113 mg/dL — ABNORMAL HIGH (ref 70–99)
Potassium: 3.5 mmol/L (ref 3.5–5.1)
Sodium: 136 mmol/L (ref 135–145)
Total Bilirubin: 0.5 mg/dL (ref 0.3–1.2)
Total Protein: 7.2 g/dL (ref 6.5–8.1)

## 2021-02-18 LAB — APTT: aPTT: 37 seconds — ABNORMAL HIGH (ref 24–36)

## 2021-02-18 LAB — CBC
HCT: 43.6 % (ref 39.0–52.0)
Hemoglobin: 15.1 g/dL (ref 13.0–17.0)
MCH: 29.4 pg (ref 26.0–34.0)
MCHC: 34.6 g/dL (ref 30.0–36.0)
MCV: 85 fL (ref 80.0–100.0)
Platelets: 222 10*3/uL (ref 150–400)
RBC: 5.13 MIL/uL (ref 4.22–5.81)
RDW: 13.2 % (ref 11.5–15.5)
WBC: 10.2 10*3/uL (ref 4.0–10.5)
nRBC: 0 % (ref 0.0–0.2)

## 2021-02-18 LAB — CBG MONITORING, ED: Glucose-Capillary: 112 mg/dL — ABNORMAL HIGH (ref 70–99)

## 2021-02-18 LAB — PROTIME-INR
INR: 1 (ref 0.8–1.2)
Prothrombin Time: 12.9 seconds (ref 11.4–15.2)

## 2021-02-18 LAB — TROPONIN I (HIGH SENSITIVITY): Troponin I (High Sensitivity): 12 ng/L (ref <18)

## 2021-02-18 LAB — ETHANOL: Alcohol, Ethyl (B): <10 mg/dL (ref <10)

## 2021-02-18 MED ORDER — IOHEXOL 350 MG/ML SOLN
100.0000 mL | Freq: Once | INTRAVENOUS | Status: AC | PRN
  Administered 2021-02-18: 100 mL via INTRAVENOUS

## 2021-02-18 MED ORDER — ONDANSETRON HCL 4 MG/2ML IJ SOLN
4.0000 mg | Freq: Once | INTRAMUSCULAR | Status: AC
  Administered 2021-02-18: 4 mg via INTRAVENOUS
  Filled 2021-02-18: qty 2

## 2021-02-18 MED ORDER — ONDANSETRON 4 MG PO TBDP: 4.0000 mg | ORAL_TABLET | Freq: Three times a day (TID) | ORAL | 0 refills | Status: AC | PRN

## 2021-02-18 NOTE — ED Provider Notes (Signed)
[facility] EMERGENCY DEPARTMENT Provider Note   CSN: [id number] Arrival date & time: 02/18/21  1648     History Chief Complaint  Patient presents with  . Dizziness    Kejuan Bekker is a 70 y.o. male with a past medical history significant for diabetes, GERD, hyperlipidemia, hypertension, history of MI, and seizures who presents to the ED due to dizziness.  Patient states he was sitting down around 4 PM when he felt acutely dizzy which he describes as a feeling of leaning towards the right. Episode lasted roughly 30 minutes. No prodromal symptoms. He endorses nausea following the dizziness. No current dizziness. Denies history of CVA.  Denies visual changes, speech changes, and unilateral weakness. He notes his forgot his glasses today and had to borrow a co-workers which was a different prescription. Denies preceding chest pain, shortness of breath, abdominal pain. Patient notes Monday he felt a "bandlike" sensation around his chest after unloading a truck. No chest pain since. No recent illness. No fever or chills. Normal po intake. No treatment prior to arrival. No arrivating of alleviating symptoms.    Patient notes he is currently being treated for a dental infection on his left side. He was prescribed Augmentin yesterday which he has been compliant with. Prior to dental evaluation, he notes his left ear felt like it was "under water". He has a history of Meniere's disease when he was 70 years old.   History obtained from patient and past medical records. No interpreter used during encounter.      Past Medical History:  Diagnosis Date  . Arthritis    hip , knee   . Diabetes mellitus without complication ([place])   . Erectile dysfunction   . GERD (gastroesophageal reflux disease)   . Glucose intolerance (impaired glucose tolerance)   . Heart murmur   . Hyperlipidemia   . Hypertension   . Myocardial infarction ([place])    mild 12-13 years ago   . 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 Tachycardia    received amiodarone- 6 yrs ago in [place]     Patient Active Problem List   Diagnosis Date Noted  . Difficult intubation 11/21/2018  . Headache(784.0) 06/09/2014  . Neck pain 06/09/2014    Past Surgical History:  Procedure Laterality Date  . COLONOSCOPY  2008   in [place]  . ESOPHAGOGASTRODUODENOSCOPY  2008   in [place]  . HIP SURGERY Right 2017   replacement  . KNEE ARTHROSCOPY Left   . MANDIBLE SURGERY     broken jaw  . NOSE SURGERY     x2  . right foot surgery         Family History  Problem Relation Age of Onset  . Headache Neg Hx   . Colon cancer Neg Hx   . Colon polyps Neg Hx   . Esophageal cancer Neg Hx   . Rectal cancer Neg Hx   . Stomach cancer Neg Hx     Social History   Tobacco Use  . Smoking status: Never Smoker  . Smokeless tobacco: Never Used  Vaping Use  . Vaping Use: Never used  Substance Use Topics  . Alcohol use: No  . Drug use: No    Home Medications Prior to Admission medications   Medication Sig Start Date End Date Taking? Authorizing Provider  ondansetron (ZOFRAN ODT) 4 MG disintegrating tablet Take 1 tablet (4 mg total) by mouth every 8 (eight)  hours as needed for nausea or vomiting. 02/18/21  Yes Sencere Symonette, Druscilla Brownie, PA-C  Acetaminophen (TYLENOL PO) Take 1 tablet by mouth as needed.    [provider]  AMBULATORY NON FORMULARY MEDICATION Take 1 tablet by mouth daily. Vitamin b. Not sure dosage    [provider]  amLODipine (NORVASC) 10 MG tablet Take 10 mg by mouth daily.    [provider]  aspirin EC 81 MG tablet Take 81 mg by mouth daily.    [provider]  atenolol (TENORMIN) 100 MG tablet Take 100 mg by mouth daily.    [provider]  chlorthalidone (HYGROTON) 25 MG tablet TAKE 1 TABLET BY MOUTH ONCE DAILY FOR 30 DAYS 09/29/18   [provider]  Cholecalciferol (VITAMIN D3) 125 MCG (5000 UT) TABS Take by mouth.    [provider]  diclofenac sodium (VOLTAREN) 1 % GEL Apply topically 4 (four) times daily.    [provider]  etodolac (LODINE) 400 MG tablet Take by mouth. 06/13/18   [provider]  losartan (COZAAR) 50 MG tablet Take 50 mg by mouth daily. 09/29/18   [provider]  metFORMIN (GLUCOPHAGE) 500 MG tablet Take 500 mg by mouth 2 (two) times daily. 09/30/18   [provider]  Multiple Vitamins-Minerals (MULTIVITAMIN ADULT PO) Take by mouth.    [provider]  naproxen (NAPROSYN) 500 MG tablet Take 500 mg by mouth 2 (two) times daily as needed. 11/01/18   [provider]  omeprazole (PRILOSEC) 20 MG capsule Take 20 mg by mouth daily.    [provider]  predniSONE (DELTASONE) 10 MG tablet  06/04/14   [provider]  simvastatin (ZOCOR) 20 MG tablet TAKE 1 TABLET BY MOUTH AT BEDTIME FOR 90 DAYS 09/29/18   [provider]  Sod Picosulfate-Mag Ox-Cit Acd (CLENPIQ) 10-3.5-12 MG-GM -GM/160ML SOLN Take 1 kit by mouth as directed. 11/22/18   Cirigliano, Vito V, DO  sulindac (CLINORIL) 200 MG tablet TAKE 1 TABLET BY MOUTH TWICE DAILY FOR 30 DAYS 10/30/18   [provider]  vardenafil (LEVITRA) 20 MG tablet Take 20 mg by mouth daily.    [provider]  vitamin B-12 (CYANOCOBALAMIN) 1000 MCG tablet Take by mouth.    [provider]  zolpidem (AMBIEN) 10 MG tablet Take 10 mg by mouth at bedtime. 10/31/18   [provider]    Allergies    Other  Review of Systems   Review of Systems  Constitutional: Negative for chills and fever.  Eyes: Negative for visual disturbance.  Cardiovascular: Positive for chest pain (Monday).  Gastrointestinal: Positive for nausea. Negative for abdominal pain, diarrhea and vomiting.  Neurological: Positive for dizziness. Negative for syncope, speech difficulty, weakness, numbness and headaches.  All other systems reviewed and are negative.   Physical Exam Updated  Vital Signs BP (!) 147/71   Pulse (!) 56   Temp 97.7 F (36.5 C) (Oral)   Resp 14   Ht '5\' 7"'  (1.702 m)   Wt 88.5 kg   SpO2 98%   BMI 30.54 kg/m   Physical Exam Vitals and nursing note reviewed.  Constitutional:      General: He is not in acute distress.    Appearance: He is not ill-appearing.  HENT:     Head: Normocephalic.  Eyes:     Pupils: Pupils are equal, round, and reactive to light.  Cardiovascular:     Rate and Rhythm: Normal rate and regular rhythm.  Pulses: Normal pulses.     Heart sounds: Normal heart sounds. No murmur heard. No friction rub. No gallop.   Pulmonary:     Effort: Pulmonary effort is normal.     Breath sounds: Normal breath sounds.  Abdominal:     General: Abdomen is flat. There is no distension.     Palpations: Abdomen is soft.     Tenderness: There is no abdominal tenderness. There is no guarding or rebound.  Musculoskeletal:        General: Normal range of motion.     Cervical back: Neck supple.  Skin:    General: Skin is warm and dry.  Neurological:     General: No focal deficit present.     Mental Status: He is alert.     Comments: Speech is clear, able to follow commands CN III-XII intact Normal strength in upper and lower extremities bilaterally including dorsiflexion and plantar flexion, strong and equal grip strength Sensation grossly intact throughout Moves extremities without ataxia, coordination intact No pronator drift Ambulates without difficulty  Psychiatric:        Mood and Affect: Mood normal.        Behavior: Behavior normal.     ED Results / Procedures / Treatments   Labs (all labs ordered are listed, but only abnormal results are displayed) Labs Reviewed  APTT - Abnormal; Notable for the following components:      Result Value   aPTT 37 (*)    All other components within normal limits  COMPREHENSIVE METABOLIC PANEL - Abnormal; Notable for the following components:   Glucose, Bld 113 (*)    BUN 32 (*)     All other components within normal limits  URINALYSIS, ROUTINE W REFLEX MICROSCOPIC - Abnormal; Notable for the following components:   Color, Urine STRAW (*)    All other components within normal limits  CBG MONITORING, ED - Abnormal; Notable for the following components:   Glucose-Capillary 112 (*)    All other components within normal limits  ETHANOL  PROTIME-INR  CBC  DIFFERENTIAL  RAPID URINE DRUG SCREEN, HOSP PERFORMED  TROPONIN I (HIGH SENSITIVITY)    EKG EKG Interpretation  Date/Time:  Friday February 18 2021 16:57:17 EDT Ventricular Rate:  62 PR Interval:  162 QRS Duration: 85 QT Interval:  416 QTC Calculation: 423 R Axis:   45 Text Interpretation: Sinus rhythm Abnormal R-wave progression, early transition Borderline repolarization abnormality No prior ECG for comparison. NO STEMI Confirmed by Antony Blackbird [phone]) on 02/18/2021 5:21:25 PM   Radiology CT Angio Head W or Wo Contrast  Result Date: 02/18/2021 CLINICAL DATA:  Dizziness EXAM: CT ANGIOGRAPHY HEAD AND NECK TECHNIQUE: Multidetector CT imaging of the head and neck was performed using the standard protocol during bolus administration of intravenous contrast. Multiplanar CT image reconstructions and MIPs were obtained to evaluate the vascular anatomy. Carotid stenosis measurements (when applicable) are obtained utilizing NASCET criteria, using the distal internal carotid diameter as the denominator. CONTRAST:  162m OMNIPAQUE IOHEXOL 350 MG/ML SOLN COMPARISON:  None. FINDINGS: CT HEAD FINDINGS Brain: There is no mass, hemorrhage or extra-axial collection. There is generalized atrophy without lobar predilection. Prominent perivascular space versus old small vessel infarct the right lentiform nucleus. There is hypoattenuation of the periventricular white matter, most commonly indicating chronic ischemic microangiopathy. Skull: The visualized skull base, calvarium and extracranial soft tissues are normal. Sinuses/Orbits: No  fluid levels or advanced mucosal thickening of the visualized paranasal sinuses. No mastoid or middle ear effusion. The  orbits are normal. CTA NECK FINDINGS SKELETON: There is no bony spinal canal stenosis. No lytic or blastic lesion. OTHER NECK: Normal pharynx, larynx and major salivary glands. No cervical lymphadenopathy. Unremarkable thyroid gland. UPPER CHEST: No pneumothorax or pleural effusion. No nodules or masses. AORTIC ARCH: There is calcific atherosclerosis of the aortic arch. There is no aneurysm, dissection or hemodynamically significant stenosis of the visualized portion of the aorta. Conventional 3 vessel aortic branching pattern. The visualized proximal subclavian arteries are widely patent. RIGHT CAROTID SYSTEM: Normal without aneurysm, dissection or stenosis. LEFT CAROTID SYSTEM: No dissection, occlusion or aneurysm. There is mixed density atherosclerosis extending into the proximal ICA, resulting in less than 50% stenosis. VERTEBRAL ARTERIES: Left dominant configuration. Both origins are clearly patent. There is no dissection, occlusion or flow-limiting stenosis to the skull base (V1-V3 segments). CTA HEAD FINDINGS Mildly motion degraded. POSTERIOR CIRCULATION: --Vertebral arteries: Normal V4 segments. --Inferior cerebellar arteries: Normal. --Basilar artery: Normal. --Superior cerebellar arteries: Normal. --Posterior cerebral arteries (PCA): Normal. ANTERIOR CIRCULATION: --Intracranial internal carotid arteries: Normal. --Anterior cerebral arteries (ACA): Normal. Both A1 segments are present. Patent anterior communicating artery (a-comm). --Middle cerebral arteries (MCA): Normal. VENOUS SINUSES: As permitted by contrast timing, patent. ANATOMIC VARIANTS: None Review of the MIP images confirms the above findings. IMPRESSION: 1. Motion degraded study without emergent large vessel occlusion or high-grade stenosis of the head or neck. 2. Chronic ischemic microangiopathy and generalized atrophy. 3.  Left carotid bifurcation atherosclerosis with less than 50% stenosis by NASCET criteria. Aortic Atherosclerosis (ICD10-I70.0). Electronically Signed   By: Ulyses Jarred M.D.   On: 02/18/2021 19:20   CT Angio Neck W and/or Wo Contrast  Result Date: 02/18/2021 CLINICAL DATA:  Dizziness EXAM: CT ANGIOGRAPHY HEAD AND NECK TECHNIQUE: Multidetector CT imaging of the head and neck was performed using the standard protocol during bolus administration of intravenous contrast. Multiplanar CT image reconstructions and MIPs were obtained to evaluate the vascular anatomy. Carotid stenosis measurements (when applicable) are obtained utilizing NASCET criteria, using the distal internal carotid diameter as the denominator. CONTRAST:  157m OMNIPAQUE IOHEXOL 350 MG/ML SOLN COMPARISON:  None. FINDINGS: CT HEAD FINDINGS Brain: There is no mass, hemorrhage or extra-axial collection. There is generalized atrophy without lobar predilection. Prominent perivascular space versus old small vessel infarct the right lentiform nucleus. There is hypoattenuation of the periventricular white matter, most commonly indicating chronic ischemic microangiopathy. Skull: The visualized skull base, calvarium and extracranial soft tissues are normal. Sinuses/Orbits: No fluid levels or advanced mucosal thickening of the visualized paranasal sinuses. No mastoid or middle ear effusion. The orbits are normal. CTA NECK FINDINGS SKELETON: There is no bony spinal canal stenosis. No lytic or blastic lesion. OTHER NECK: Normal pharynx, larynx and major salivary glands. No cervical lymphadenopathy. Unremarkable thyroid gland. UPPER CHEST: No pneumothorax or pleural effusion. No nodules or masses. AORTIC ARCH: There is calcific atherosclerosis of the aortic arch. There is no aneurysm, dissection or hemodynamically significant stenosis of the visualized portion of the aorta. Conventional 3 vessel aortic branching pattern. The visualized proximal subclavian  arteries are widely patent. RIGHT CAROTID SYSTEM: Normal without aneurysm, dissection or stenosis. LEFT CAROTID SYSTEM: No dissection, occlusion or aneurysm. There is mixed density atherosclerosis extending into the proximal ICA, resulting in less than 50% stenosis. VERTEBRAL ARTERIES: Left dominant configuration. Both origins are clearly patent. There is no dissection, occlusion or flow-limiting stenosis to the skull base (V1-V3 segments). CTA HEAD FINDINGS Mildly motion degraded. POSTERIOR CIRCULATION: --Vertebral arteries: Normal V4 segments. --Inferior cerebellar arteries: Normal. --Basilar  artery: Normal. --Superior cerebellar arteries: Normal. --Posterior cerebral arteries (PCA): Normal. ANTERIOR CIRCULATION: --Intracranial internal carotid arteries: Normal. --Anterior cerebral arteries (ACA): Normal. Both A1 segments are present. Patent anterior communicating artery (a-comm). --Middle cerebral arteries (MCA): Normal. VENOUS SINUSES: As permitted by contrast timing, patent. ANATOMIC VARIANTS: None Review of the MIP images confirms the above findings. IMPRESSION: 1. Motion degraded study without emergent large vessel occlusion or high-grade stenosis of the head or neck. 2. Chronic ischemic microangiopathy and generalized atrophy. 3. Left carotid bifurcation atherosclerosis with less than 50% stenosis by NASCET criteria. Aortic Atherosclerosis (ICD10-I70.0). Electronically Signed   By: Ulyses Jarred M.D.   On: 02/18/2021 19:20   DG Chest Portable 1 View  Result Date: 02/18/2021 CLINICAL DATA:  Chest pain and dizziness EXAM: PORTABLE CHEST 1 VIEW COMPARISON:  January 11, 2016 FINDINGS: The heart size and mediastinal contours are within normal limits. Both lungs are clear. The visualized skeletal structures are unremarkable. IMPRESSION: No active cardiopulmonary disease. Electronically Signed   By: Dahlia Bailiff MD   On: 02/18/2021 18:27    Procedures Procedures   Medications Ordered in ED Medications   ondansetron [facility]) injection 4 mg (4 mg Intravenous Given 02/18/21 1755)  iohexol (OMNIPAQUE) 350 MG/ML injection 100 mL (100 mLs Intravenous Contrast Given 02/18/21 1834)    ED Course  I have reviewed the triage vital signs and the nursing notes.  Pertinent labs & imaging results that were available during my care of the patient were reviewed by me and considered in my medical decision making (see chart for details).    MDM Rules/Calculators/A&P                         70 year old male presents to the ED due to dizziness that lasted roughly 30 minutes.  Patient describes dizziness as a leaning to the right sensation.  No speech changes, visual changes, numbness/tingling, or unilateral weakness.  No prodromal symptoms.  No history of CVA.  Upon arrival, stable vitals.  Patient in no acute distress and non-ill-appearing.  Physical exam reassuring.  Normal neurological exam.  Patient able to ambulate in the ED without difficulty.  No visible leaning. Routine labs ordered. Discussed case with Dr. Rory Percy with neurology who recommends CTA head/neck. If normal, patient may be discharged with outpatient neurology follow-up for MRI. Dizziness most likely peripheral etiology per Dr. Rory Percy. IV zofran given for nausea.   CBC unremarkable no leukocytosis and normal hemoglobin.  Significant for hyperglycemia at 113 with no anion gap.  Doubt DKA.  Mild elevation in BUN.  Normal creatinine.  No major electrolyte derangements.  Troponin normal at 12.  Given patient's last episode of chest pain was 4 days ago no delta troponin warranted.  Doubt ACS.  Chest x-ray personally reviewed which is negative for signs of pneumonia, pneumothorax, widened mediastinum.  EKG personally reviewed which demonstrates normal sinus rhythm with early repolarization.  No signs of acute ischemia.  CTA head/neck personally reviewed which demonstrates:   IMPRESSION:  1. Motion degraded study without emergent large vessel occlusion or   high-grade stenosis of the head or neck.  2. Chronic ischemic microangiopathy and generalized atrophy.  3. Left carotid bifurcation atherosclerosis with less than 50%  stenosis by NASCET criteria.   UA negative for signs of infection.  UDS negative. Low suspicion for CVA given no neurological deficits on exam. Neurology number given to patient at discharge. Instructed patient to call Monday to schedule an appointment for further evaluation.  Strict ED precautions discussed with patient. Patient states understanding and agrees to plan. Patient discharged home in no acute distress and stable vitals.  Discussed case with Dr. Sherry Ruffing who agrees with assessment and plan.  Final Clinical Impression(s) / ED Diagnoses Final diagnoses:  Dizziness  Nausea    Rx / DC Orders ED Discharge Orders         Ordered    ondansetron (ZOFRAN ODT) 4 MG disintegrating tablet  Every 8 hours PRN        02/18/21 1934           Karie Kirks 02/18/21 1938    Tegeler, Gwenyth Allegra, MD 02/18/21 [phone]

## 2021-02-18 NOTE — Discharge Instructions (Signed)
As discussed, all of your labs and imaging were reassuring today.  I spoke to the on-call neurologist who recommended an outpatient MRI of your brain.  Please call Monday to schedule an appointment with the neurologist.  I am sending you home with nausea medication.  Take as needed for nausea. Return to the ER if you develop recurrent dizziness, speech/visual changes, one sided weakness, or worsening symptoms.

## 2021-02-18 NOTE — ED Triage Notes (Signed)
Pt c/o sudden onset dizziness, nausea 4pm while seated-last "couple of minutes"-denies pain-states he is taking augmentin for tooth-NAD-steady gait

## 2021-02-18 NOTE — ED Notes (Signed)
Patient transported to CT 

## 2022-02-15 IMAGING — CT CT ANGIO HEAD
1 of 11 series · 5 of 33 positions shown · IV contrast (Omnipaque)
Comparison: None.

CLINICAL DATA: Dizziness

EXAM:
CT ANGIOGRAPHY HEAD AND NECK
TECHNIQUE: Multidetector CT imaging of the head and neck was performed using
the standard protocol during bolus administration of intravenous
contrast. Multiplanar CT image reconstructions and MIPs were
obtained to evaluate the vascular anatomy. Carotid stenosis
measurements (when applicable) are obtained utilizing NASCET
criteria, using the distal internal carotid diameter as the
denominator.
CONTRAST:  100mL OMNIPAQUE IOHEXOL 350 MG/ML SOLN

[Series 9: axial thin · axial · 0.39mm/px · z∈[+692,+902]mm · 5 of 352 slices shown]
[im 59/352  soft-tissue]
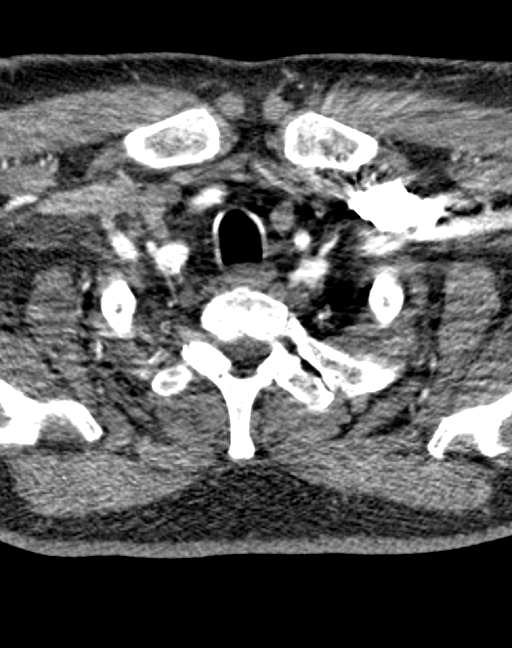
[im 118/352  bone]
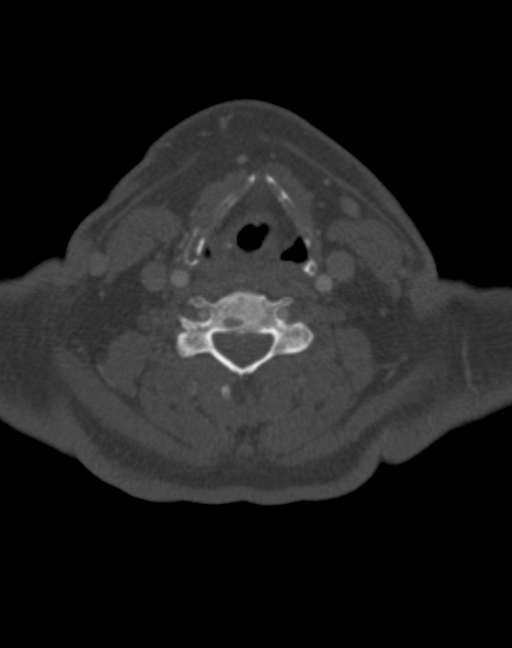
[im 176/352  soft-tissue]
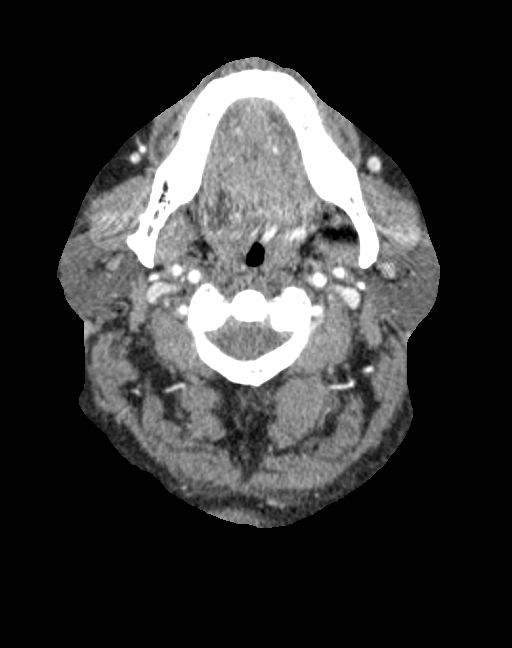
[im 235/352  bone]
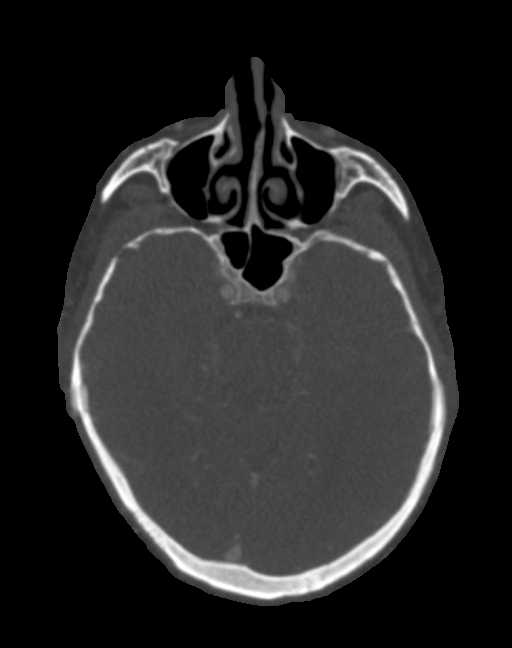
[im 293/352  soft-tissue]
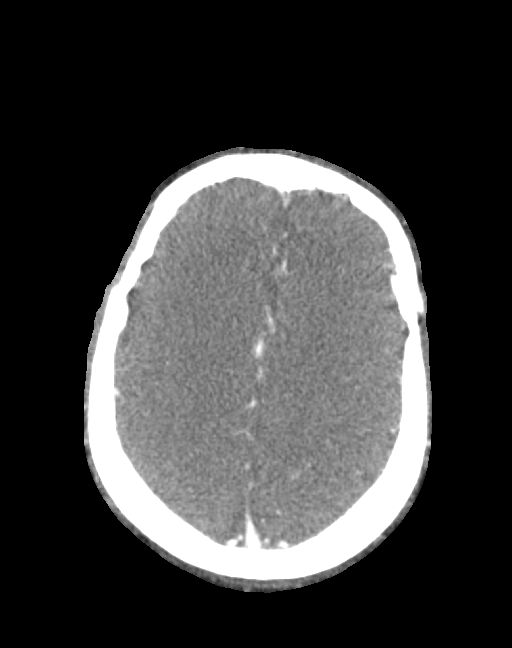

[5 of 33 positions shown; findings below may reference images not displayed]

FINDINGS: CT HEAD FINDINGS

Brain: There is no mass, hemorrhage or extra-axial collection. There
is generalized atrophy without lobar predilection. Prominent
perivascular space versus old small vessel infarct the right
lentiform nucleus. There is hypoattenuation of the periventricular
white matter, most commonly indicating chronic ischemic
microangiopathy.

Skull: The visualized skull base, calvarium and extracranial soft
tissues are normal.

Sinuses/Orbits: No fluid levels or advanced mucosal thickening of
the visualized paranasal sinuses. No mastoid or middle ear effusion.
The orbits are normal.

CTA NECK FINDINGS

SKELETON: There is no bony spinal canal stenosis. No lytic or
blastic lesion.

OTHER NECK: Normal pharynx, larynx and major salivary glands. No
cervical lymphadenopathy. Unremarkable thyroid gland.

UPPER CHEST: No pneumothorax or pleural effusion. No nodules or
masses.

AORTIC ARCH:

There is calcific atherosclerosis of the aortic arch. There is no
aneurysm, dissection or hemodynamically significant stenosis of the
visualized portion of the aorta. Conventional 3 vessel aortic
branching pattern. The visualized proximal subclavian arteries are
widely patent.

RIGHT CAROTID SYSTEM: Normal without aneurysm, dissection or
stenosis.

LEFT CAROTID SYSTEM: No dissection, occlusion or aneurysm. There is
mixed density atherosclerosis extending into the proximal ICA,
resulting in less than 50% stenosis.

VERTEBRAL ARTERIES: Left dominant configuration. Both origins are
clearly patent. There is no dissection, occlusion or flow-limiting
stenosis to the skull base (V1-V3 segments).

CTA HEAD FINDINGS

Mildly motion degraded.

POSTERIOR CIRCULATION:

--Vertebral arteries: Normal V4 segments.

--Inferior cerebellar arteries: Normal.

--Basilar artery: Normal.

--Superior cerebellar arteries: Normal.

--Posterior cerebral arteries (PCA): Normal.

ANTERIOR CIRCULATION:

--Intracranial internal carotid arteries: Normal.

--Anterior cerebral arteries (ACA): Normal. Both A1 segments are
present. Patent anterior communicating artery (a-comm).

--Middle cerebral arteries (MCA): Normal.

VENOUS SINUSES: As permitted by contrast timing, patent.

ANATOMIC VARIANTS: None

Review of the MIP images confirms the above findings.
IMPRESSION: 1. Motion degraded study without emergent large vessel occlusion or
high-grade stenosis of the head or neck.
2. Chronic ischemic microangiopathy and generalized atrophy.
3. Left carotid bifurcation atherosclerosis with less than 50%
stenosis by NASCET criteria.

Aortic Atherosclerosis ([id number]).
